# Patient Record
Sex: Male | Born: 2000 | Race: White | Hispanic: Yes | Marital: Single | Smoking: Never smoker
Health system: Southern US, Community
[De-identification: ages and names within clinical notes are randomized; demographics above are authoritative.]

---
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
## 2018-10-31 IMAGING — CR DG CHEST 2V
2 series · 2 of 2 positions shown · non-contrast
Comparison: Chest x-ray dated August 01, 2016.

CLINICAL DATA: Increased sputum production.

EXAM:
CHEST  2 VIEW

[chest pa]
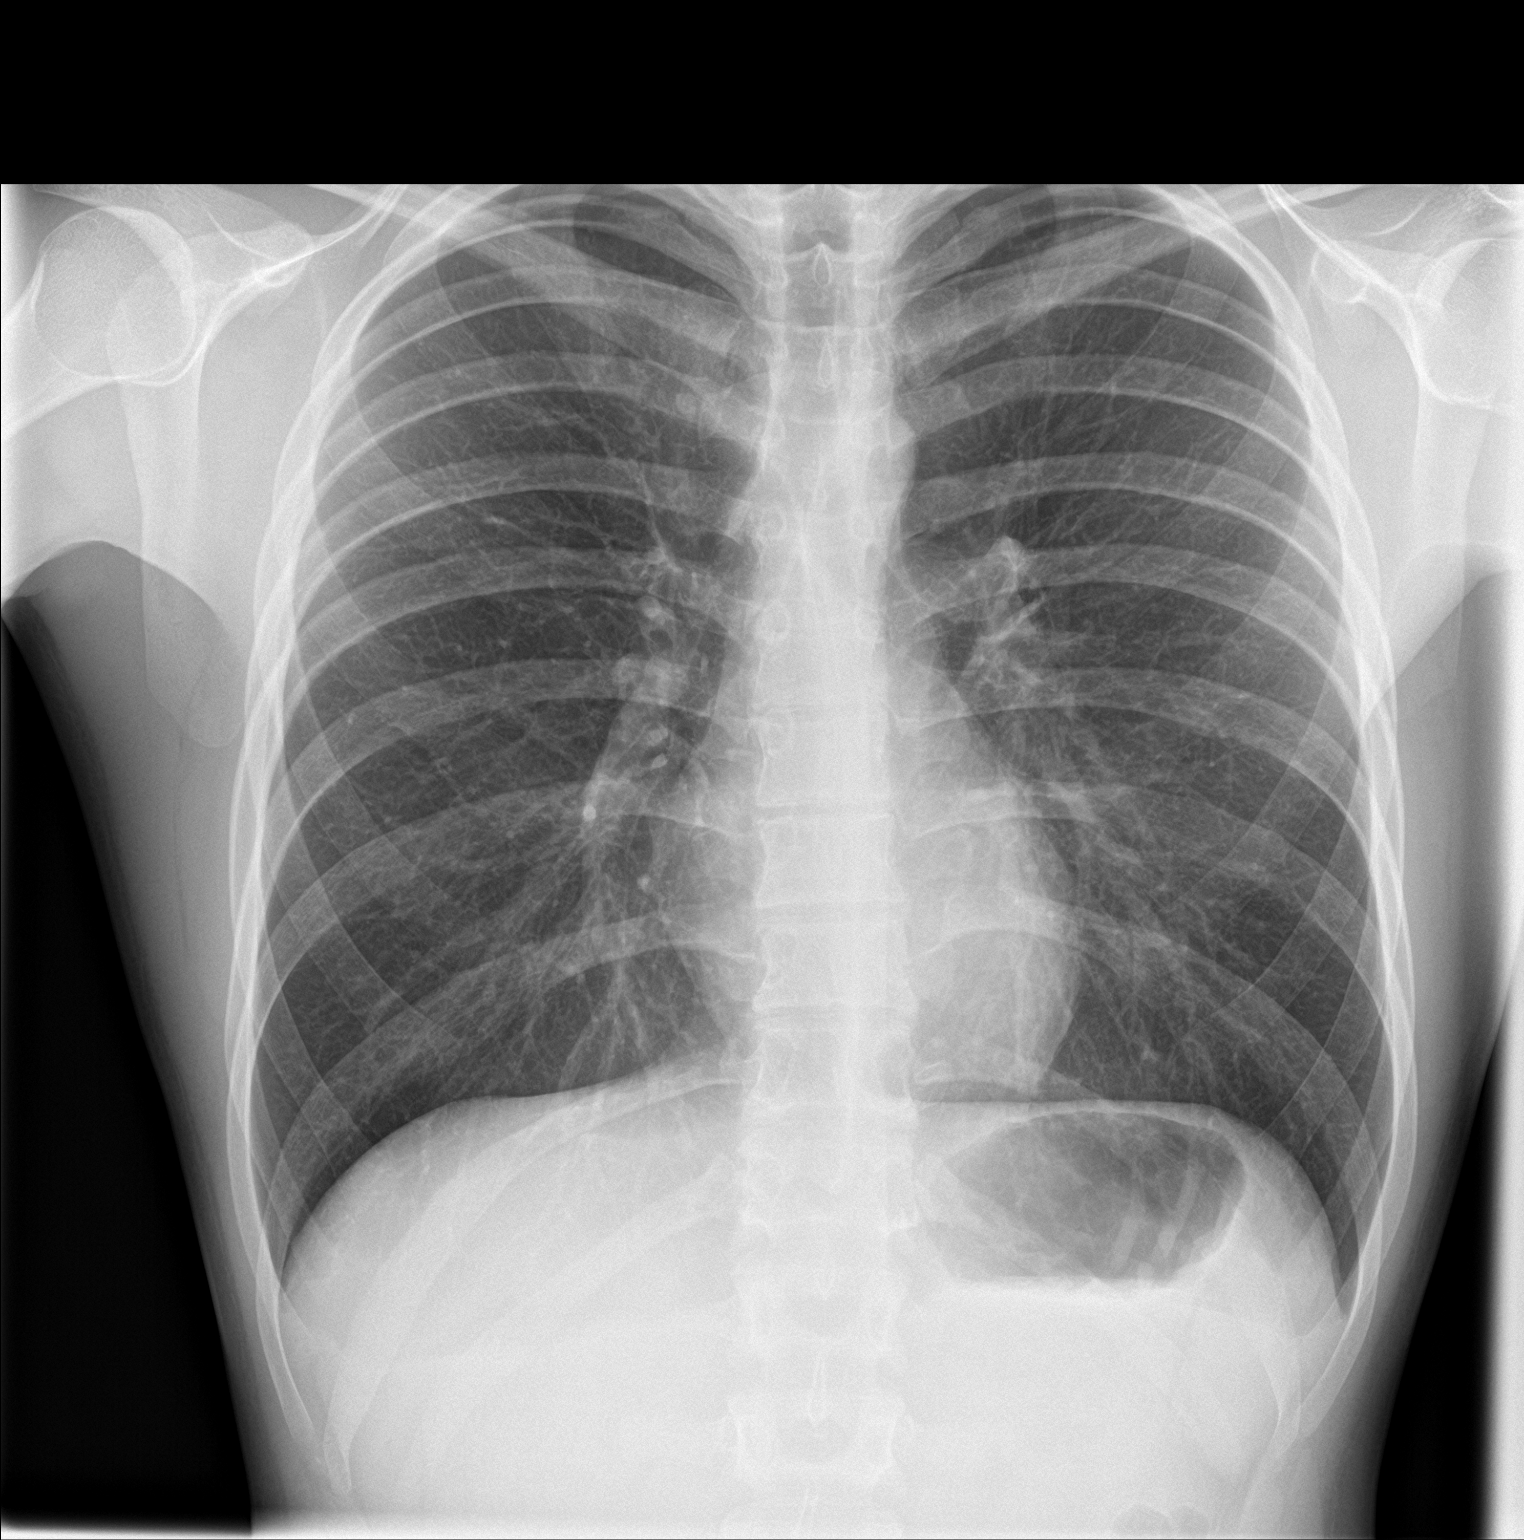

[chest lat]
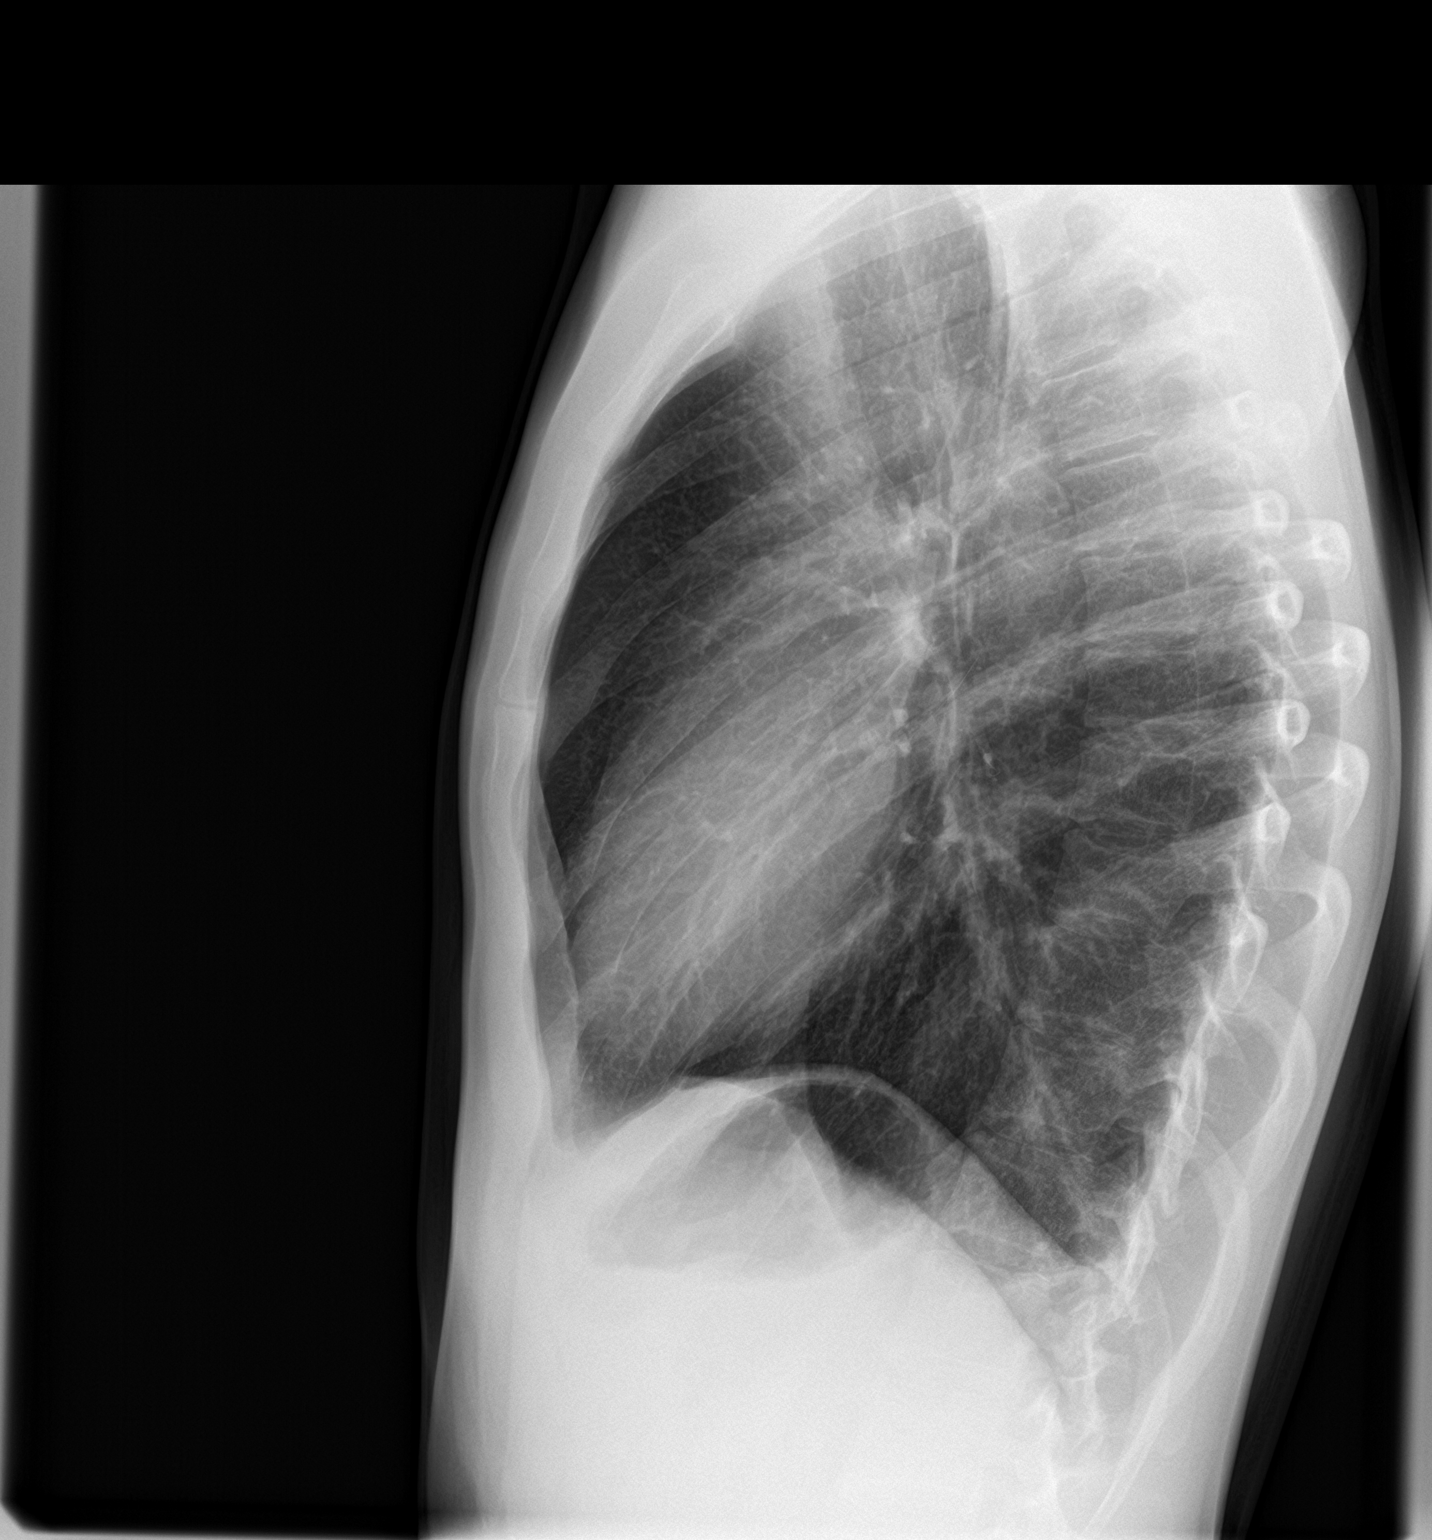

[2 of 2 positions shown; findings below may reference images not displayed]

FINDINGS: The heart size and mediastinal contours are within normal limits.
Both lungs are clear. The visualized skeletal structures are
unremarkable.
IMPRESSION: Normal chest x-ray.
